# Patient Record
Sex: Male | Born: 2000 | Hispanic: Yes | Marital: Single | Smoking: Never smoker
Health system: Southern US, Community
[De-identification: ages and names within clinical notes are randomized; demographics above are authoritative.]

## PROBLEM LIST (undated history)

## (undated) HISTORY — PX: APPENDECTOMY: SHX54

---
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
## 2018-02-21 ENCOUNTER — Other Ambulatory Visit: Payer: Self-pay

## 2018-02-21 ENCOUNTER — Encounter: Payer: Self-pay | Admitting: Emergency Medicine

## 2018-02-21 ENCOUNTER — Emergency Department
Admission: EM | Admit: 2018-02-21 | Discharge: 2018-02-21 | Disposition: A | Discharge status: Home / Self Care | Payer: Medicaid Other | Attending: Emergency Medicine | Admitting: Emergency Medicine

## 2018-02-21 DIAGNOSIS — L509 Urticaria, unspecified: Secondary | ICD-10-CM | POA: Diagnosis not present

## 2018-02-21 DIAGNOSIS — R21 Rash and other nonspecific skin eruption: Secondary | ICD-10-CM | POA: Diagnosis present

## 2018-02-21 MED ORDER — DIPHENHYDRAMINE HCL 25 MG PO CAPS
50.0000 mg | ORAL_CAPSULE | Freq: Once | ORAL | Status: AC
  Administered 2018-02-21: 50 mg via ORAL
  Filled 2018-02-21: qty 2

## 2018-02-21 MED ORDER — PREDNISONE 10 MG PO TABS: ORAL_TABLET | ORAL | 0 refills | Status: AC

## 2018-02-21 MED ORDER — DEXAMETHASONE SODIUM PHOSPHATE 10 MG/ML IJ SOLN
10.0000 mg | Freq: Once | INTRAMUSCULAR | Status: AC
  Administered 2018-02-21: 10 mg via INTRAMUSCULAR
  Filled 2018-02-21: qty 1

## 2018-02-21 MED ORDER — DIPHENHYDRAMINE HCL 50 MG PO TABS: 50.0000 mg | ORAL_TABLET | Freq: Four times a day (QID) | ORAL | 0 refills | Status: AC | PRN

## 2018-02-21 NOTE — ED Provider Notes (Signed)
[facility] Emergency Department Provider Note   ____________________________________________   None    (approximate)  I have reviewed the triage vital signs and the nursing notes.   HISTORY  Chief Complaint Rash   HPI Todd Cole is a 17 y.o. male is here with complaint of rash.  Patient states that it began itching.  Mother took a picture of the rash last evening which is multiple erythematous patches.  Patient has not taken any medication for the itching.  He states that he only slept one hour.  He is fearful that these are bedbugs.  He denies any difficulty with breathing, speaking, or swallowing.  Mother states that he has had allergies in the past.  History reviewed. No pertinent past medical history.  There are no active problems to display for this patient.   Past Surgical History:  Procedure Laterality Date  . APPENDECTOMY      Prior to Admission medications   Medication Sig Start Date End Date Taking? Authorizing Provider  diphenhydrAMINE (BENADRYL) 50 MG tablet Take 1 tablet (50 mg total) by mouth every 6 (six) hours as needed for itching. 02/21/18   Tommi Rumps, PA-C  predniSONE (DELTASONE) 10 MG tablet Take 6 tablets  today, on day 2 take 5 tablets, day 3 take 4 tablets, day 4 take 3 tablets, day 5 take  2 tablets and 1 tablet the last day 02/21/18   Tommi Rumps, PA-C    Allergies Patient has no known allergies.  No family history on file.  Social History Social History   Tobacco Use  . Smoking status: Never Smoker  . Smokeless tobacco: Never Used  Substance Use Topics  . Alcohol use: Not on file  . Drug use: Not on file    Review of Systems Constitutional: No fever/chills Eyes: No visual changes. ENT: No sore throat. Cardiovascular: Denies chest pain. Respiratory: Denies shortness of breath. Gastrointestinal:  No nausea, no vomiting.   Musculoskeletal: Negative for  muscle aches Skin: Positive for  rash. Neurological: Negative for headaches, focal weakness or numbness. ____________________________________________   PHYSICAL EXAM:  VITAL SIGNS: ED Triage Vitals  Enc Vitals Group     BP 02/21/18 0628 (!) 142/78     Pulse Rate 02/21/18 0628 79     Resp 02/21/18 0628 18     Temp 02/21/18 0628 98 F (36.7 C)     Temp Source 02/21/18 0628 Oral     SpO2 02/21/18 0628 97 %     Weight 02/21/18 0625 250 lb (113.4 kg)     Height 02/21/18 0625  (1.803 m)     Head Circumference --      Peak Flow --      Pain Score 02/21/18 0624 0     Pain Loc --      Pain Edu? --      Excl. in GC? --    Constitutional: Alert and oriented. Well appearing and in no acute distress. Eyes: Conjunctivae are normal.  Head: Atraumatic. Nose: No congestion/rhinnorhea. Mouth/Throat: Mucous membranes are moist.  Oropharynx non-erythematous.  No edema and uvula is midline. Neck: No stridor.   Cardiovascular: Normal rate, regular rhythm. Grossly normal heart sounds.  Good peripheral circulation. Respiratory: Normal respiratory effort.  No retractions. Lungs CTAB. Musculoskeletal: Moves upper and lower extremities then difficulty.  Normal gait was noted. Neurologic:  Normal speech and language. No gross focal neurologic deficits are appreciated. No gait instability. Skin:  Skin is warm, dry  and intact.  Diffuse macular erythematous patches over the trunk and extremities.  In comparison with recent pictures erythematous areas are faded. Psychiatric: Mood and affect are normal. Speech and behavior are normal.  ____________________________________________   LABS (all labs ordered are listed, but only abnormal results are displayed)  Labs Reviewed - No data to display  PROCEDURES  Procedure(s) performed: None  Procedures  Critical Care performed: No  ____________________________________________   INITIAL IMPRESSION / ASSESSMENT AND PLAN / ED COURSE  As part of my medical decision making, I  reviewed the following data within the electronic MEDICAL RECORD NUMBER Notes from prior ED visits and [place] Controlled Substance Database  Patient was given Decadron IM and Benadryl 50 mg p.o.  Patient also was given a prescription for prednisone 60 mg 6-day taper.  He is to continue taking Benadryl as needed for itching.  Mother will make an appointment with the pediatrician and for further allergy testing.  She is aware that if his symptoms are worsening she is to come to the emergency department immediately.  ____________________________________________   FINAL CLINICAL IMPRESSION(S) / ED DIAGNOSES  Final diagnoses:  Urticaria     ED Discharge Orders        Ordered    predniSONE (DELTASONE) 10 MG tablet     02/21/18 0747    diphenhydrAMINE (BENADRYL) 50 MG tablet  Every 6 hours PRN     02/21/18 0747       Note:  This document was prepared using Dragon voice recognition software and may include unintentional dictation errors.    Tommi Rumps, PA-C 02/21/18 1154    Sharman Cheek, MD 02/22/18 1538

## 2018-02-21 NOTE — ED Notes (Signed)
Pt reports Wednesday started with a small rash on his arm and now he has a rash and hives over his body. Denies use of new lotions, clothes, foods, etc. Denies SOB

## 2018-02-21 NOTE — ED Notes (Signed)
Pr verbalizes understanding of d/c instructions, and follow up

## 2018-02-21 NOTE — Discharge Instructions (Addendum)
Follow-up with your child's pediatrician if any continued problems.  He will need to have allergy testing to determine what is causing his hives.  Continue with Benadryl every 6 hours as needed for itching.  Begin taking prednisone today starting with 6 tablets and tapering down.

## 2018-02-21 NOTE — ED Triage Notes (Signed)
Patient ambulatory to triage with steady gait, without difficulty or distress noted; pt reports generalized itching & rash with no known cause

## 2018-11-29 ENCOUNTER — Emergency Department
Admission: EM | Admit: 2018-11-29 | Discharge: 2018-11-29 | Disposition: A | Discharge status: Home / Self Care | Payer: Medicaid Other | Attending: Emergency Medicine | Admitting: Emergency Medicine

## 2018-11-29 ENCOUNTER — Other Ambulatory Visit: Payer: Self-pay

## 2018-11-29 DIAGNOSIS — H9192 Unspecified hearing loss, left ear: Secondary | ICD-10-CM | POA: Diagnosis present

## 2018-11-29 DIAGNOSIS — H6123 Impacted cerumen, bilateral: Secondary | ICD-10-CM | POA: Diagnosis not present

## 2018-11-29 NOTE — ED Provider Notes (Signed)
[facility] Emergency Department Provider Note   ____________________________________________   First MD Initiated Contact with Patient 11/29/18 0308     (approximate)  I have reviewed the triage vital signs and the nursing notes.   HISTORY  Chief Complaint left ear hearing loss    HPI Todd Cole is a 18 y.o. male who presents to the ED from home with a chief complaint of left ear feeling "plugged up".  Patient states he cleans his ears daily with a Q-tip.  It had been about 1 week since he last cleaned his ears.  States he was cleaning his ears tonight when he felt like the Q-tip pushed some earwax against his eardrum.  He tried rinsing it out with water but it made it feel worse.  His family member brought some liquid ear wax remover which he applied and got a tiny bit of earwax out.  Denies blood on the end of his Q-tip.  Denies recent barotrauma.  Voices no other complaints.    Past medical history None  There are no active problems to display for this patient.   Past Surgical History:  Procedure Laterality Date  . APPENDECTOMY      Prior to Admission medications   Medication Sig Start Date End Date Taking? Authorizing Provider  diphenhydrAMINE (BENADRYL) 50 MG tablet Take 1 tablet (50 mg total) by mouth every 6 (six) hours as needed for itching. 02/21/18   Tommi Rumps, PA-C  predniSONE (DELTASONE) 10 MG tablet Take 6 tablets  today, on day 2 take 5 tablets, day 3 take 4 tablets, day 4 take 3 tablets, day 5 take  2 tablets and 1 tablet the last day 02/21/18   Tommi Rumps, PA-C    Allergies Patient has no known allergies.  No family history on file.  Social History Social History   Tobacco Use  . Smoking status: Never Smoker  . Smokeless tobacco: Never Used  Substance Use Topics  . Alcohol use: Not on file  . Drug use: Not on file    Review of Systems  Constitutional: No fever/chills Eyes: No visual  changes. ENT: Positive for left ear feeling plugged up.  No sore throat. Cardiovascular: Denies chest pain. Respiratory: Denies shortness of breath. Gastrointestinal: No abdominal pain.  No nausea, no vomiting.  No diarrhea.  No constipation. Genitourinary: Negative for dysuria. Musculoskeletal: Negative for back pain. Skin: Negative for rash. Neurological: Negative for headaches, focal weakness or numbness.   ____________________________________________   PHYSICAL EXAM:  VITAL SIGNS: ED Triage Vitals  Enc Vitals Group     BP 11/29/18 0101 130/80     Pulse Rate 11/29/18 0101 86     Resp 11/29/18 0101 16     Temp 11/29/18 0101 98.5 F (36.9 C)     Temp Source 11/29/18 0101 Oral     SpO2 11/29/18 0101 100 %     Weight 11/29/18 0102 270 lb (122.5 kg)     Height 11/29/18 0102 5\' 11"  (1.803 m)     Head Circumference --      Peak Flow --      Pain Score 11/29/18 0101 0     Pain Loc --      Pain Edu? --      Excl. in GC? --     Constitutional: Alert and oriented. Well appearing and in no acute distress. Eyes: Conjunctivae are normal. PERRL. EOMI. Head: Atraumatic. Ears: Bilateral cerumen impaction. Nose: No congestion/rhinnorhea. Mouth/Throat: Mucous  membranes are moist.  Oropharynx non-erythematous. Neck: No stridor.   Cardiovascular: Normal rate, regular rhythm. Grossly normal heart sounds.  Good peripheral circulation. Respiratory: Normal respiratory effort.  No retractions. Lungs CTAB. Gastrointestinal: Soft and nontender. No distention. No abdominal bruits. No CVA tenderness. Musculoskeletal: No lower extremity tenderness nor edema.  No joint effusions. Neurologic:  Normal speech and language. No gross focal neurologic deficits are appreciated. No gait instability. Skin:  Skin is warm, dry and intact. No rash noted. Psychiatric: Mood and affect are normal. Speech and behavior are normal.  ____________________________________________   LABS (all labs ordered are  listed, but only abnormal results are displayed)  Labs Reviewed - No data to display ____________________________________________  EKG  None ____________________________________________  RADIOLOGY  ED MD interpretation: None  Official radiology report(s): No results found.  ____________________________________________   PROCEDURES  Procedure(s) performed: None  Procedures  Critical Care performed: No  ____________________________________________   INITIAL IMPRESSION / ASSESSMENT AND PLAN / ED COURSE  As part of my medical decision making, I reviewed the following data within the electronic MEDICAL RECORD NUMBER Nursing notes reviewed and incorporated and Notes from prior ED visits    18 year old male who presents with bilateral cerumen impaction.  Clinical Course as of Nov 30 455  Sat Nov 29, 2018  0455 Nursing irrigated both ears; large chunks of cerumen extracted.  Patient is feeling significantly better.  Reexamined bilateral TMs which are both intact.  Cautioned patient against using Q-tips daily.  Strict return precautions given.  Patient and family verbalized understanding agree with plan of care.   [JS]    Clinical Course User Index [JS] Irean Hong, MD     ____________________________________________   FINAL CLINICAL IMPRESSION(S) / ED DIAGNOSES  Final diagnoses:  Bilateral impacted cerumen     ED Discharge Orders    None       Note:  This document was prepared using Dragon voice recognition software and may include unintentional dictation errors.   Irean Hong, MD 11/29/18 [phone]

## 2018-11-29 NOTE — Discharge Instructions (Addendum)
Use drops for earwax removal.  Avoid Q-tips when possible.  Return to the ER for worsening symptoms, persistent vomiting, difficulty breathing or other concerns.

## 2018-11-29 NOTE — ED Notes (Signed)
Pharm called for hydrogen peroxide

## 2018-11-29 NOTE — ED Triage Notes (Signed)
Pt states was cleaning left ear with q tip tonight when he began to experience left sided ear hearing loss. Pt states "It feels plugged up". Pt denies pain, drainage.

## 2018-11-29 NOTE — ED Notes (Signed)
Left ear re-flushed minor bits of cerumen removed

## 2018-11-29 NOTE — ED Notes (Signed)
After 35 mins of flushing both ears, right tympanic visualized clear, left side still occluded, copious amounts of cerumen removed from both sides

## 2018-11-29 NOTE — ED Notes (Signed)
Pt with family reports after cleaning ears with q-tip sudden decrease in left side hearing, used OTC ear wax removal and feel this made problems worse

## 2018-11-29 NOTE — ED Notes (Signed)
No peripheral IV placed this visit.   Discharge instructions reviewed with patient. Questions fielded by this RN. Patient verbalizes understanding of instructions. Patient discharged home in stable condition per sung. No acute distress noted at time of discharge.   

## 2022-09-07 DIAGNOSIS — Z419 Encounter for procedure for purposes other than remedying health state, unspecified: Secondary | ICD-10-CM | POA: Diagnosis not present

## 2022-09-14 DIAGNOSIS — F419 Anxiety disorder, unspecified: Secondary | ICD-10-CM | POA: Diagnosis not present

## 2022-09-14 DIAGNOSIS — F900 Attention-deficit hyperactivity disorder, predominantly inattentive type: Secondary | ICD-10-CM | POA: Diagnosis not present

## 2022-09-26 DIAGNOSIS — F419 Anxiety disorder, unspecified: Secondary | ICD-10-CM | POA: Diagnosis not present

## 2022-09-26 DIAGNOSIS — F900 Attention-deficit hyperactivity disorder, predominantly inattentive type: Secondary | ICD-10-CM | POA: Diagnosis not present

## 2022-10-08 DIAGNOSIS — Z419 Encounter for procedure for purposes other than remedying health state, unspecified: Secondary | ICD-10-CM | POA: Diagnosis not present

## 2022-10-11 DIAGNOSIS — F900 Attention-deficit hyperactivity disorder, predominantly inattentive type: Secondary | ICD-10-CM | POA: Diagnosis not present

## 2022-10-11 DIAGNOSIS — F419 Anxiety disorder, unspecified: Secondary | ICD-10-CM | POA: Diagnosis not present

## 2022-10-16 DIAGNOSIS — F900 Attention-deficit hyperactivity disorder, predominantly inattentive type: Secondary | ICD-10-CM | POA: Diagnosis not present

## 2022-10-16 DIAGNOSIS — F419 Anxiety disorder, unspecified: Secondary | ICD-10-CM | POA: Diagnosis not present

## 2022-11-02 DIAGNOSIS — F419 Anxiety disorder, unspecified: Secondary | ICD-10-CM | POA: Diagnosis not present

## 2022-11-02 DIAGNOSIS — F900 Attention-deficit hyperactivity disorder, predominantly inattentive type: Secondary | ICD-10-CM | POA: Diagnosis not present

## 2022-11-06 DIAGNOSIS — F900 Attention-deficit hyperactivity disorder, predominantly inattentive type: Secondary | ICD-10-CM | POA: Diagnosis not present

## 2022-11-06 DIAGNOSIS — F419 Anxiety disorder, unspecified: Secondary | ICD-10-CM | POA: Diagnosis not present

## 2022-11-08 DIAGNOSIS — Z419 Encounter for procedure for purposes other than remedying health state, unspecified: Secondary | ICD-10-CM | POA: Diagnosis not present

## 2022-11-13 DIAGNOSIS — F900 Attention-deficit hyperactivity disorder, predominantly inattentive type: Secondary | ICD-10-CM | POA: Diagnosis not present

## 2022-11-20 DIAGNOSIS — F900 Attention-deficit hyperactivity disorder, predominantly inattentive type: Secondary | ICD-10-CM | POA: Diagnosis not present

## 2022-11-27 DIAGNOSIS — F900 Attention-deficit hyperactivity disorder, predominantly inattentive type: Secondary | ICD-10-CM | POA: Diagnosis not present

## 2022-12-04 DIAGNOSIS — F900 Attention-deficit hyperactivity disorder, predominantly inattentive type: Secondary | ICD-10-CM | POA: Diagnosis not present

## 2022-12-07 DIAGNOSIS — Z419 Encounter for procedure for purposes other than remedying health state, unspecified: Secondary | ICD-10-CM | POA: Diagnosis not present

## 2022-12-25 DIAGNOSIS — F419 Anxiety disorder, unspecified: Secondary | ICD-10-CM | POA: Diagnosis not present

## 2022-12-25 DIAGNOSIS — F900 Attention-deficit hyperactivity disorder, predominantly inattentive type: Secondary | ICD-10-CM | POA: Diagnosis not present

## 2023-01-01 DIAGNOSIS — F900 Attention-deficit hyperactivity disorder, predominantly inattentive type: Secondary | ICD-10-CM | POA: Diagnosis not present

## 2023-01-07 DIAGNOSIS — Z419 Encounter for procedure for purposes other than remedying health state, unspecified: Secondary | ICD-10-CM | POA: Diagnosis not present

## 2023-01-11 DIAGNOSIS — F900 Attention-deficit hyperactivity disorder, predominantly inattentive type: Secondary | ICD-10-CM | POA: Diagnosis not present

## 2023-01-11 DIAGNOSIS — F419 Anxiety disorder, unspecified: Secondary | ICD-10-CM | POA: Diagnosis not present

## 2023-01-22 DIAGNOSIS — F900 Attention-deficit hyperactivity disorder, predominantly inattentive type: Secondary | ICD-10-CM | POA: Diagnosis not present

## 2023-01-22 DIAGNOSIS — F419 Anxiety disorder, unspecified: Secondary | ICD-10-CM | POA: Diagnosis not present

## 2023-02-06 DIAGNOSIS — Z6834 Body mass index (BMI) 34.0-34.9, adult: Secondary | ICD-10-CM | POA: Diagnosis not present

## 2023-02-06 DIAGNOSIS — F900 Attention-deficit hyperactivity disorder, predominantly inattentive type: Secondary | ICD-10-CM | POA: Diagnosis not present

## 2023-02-06 DIAGNOSIS — L732 Hidradenitis suppurativa: Secondary | ICD-10-CM | POA: Diagnosis not present

## 2023-02-06 DIAGNOSIS — E6609 Other obesity due to excess calories: Secondary | ICD-10-CM | POA: Diagnosis not present

## 2023-02-06 DIAGNOSIS — Z419 Encounter for procedure for purposes other than remedying health state, unspecified: Secondary | ICD-10-CM | POA: Diagnosis not present

## 2023-02-12 DIAGNOSIS — F900 Attention-deficit hyperactivity disorder, predominantly inattentive type: Secondary | ICD-10-CM | POA: Diagnosis not present

## 2023-02-12 DIAGNOSIS — F419 Anxiety disorder, unspecified: Secondary | ICD-10-CM | POA: Diagnosis not present

## 2023-02-26 DIAGNOSIS — F419 Anxiety disorder, unspecified: Secondary | ICD-10-CM | POA: Diagnosis not present

## 2023-02-26 DIAGNOSIS — F900 Attention-deficit hyperactivity disorder, predominantly inattentive type: Secondary | ICD-10-CM | POA: Diagnosis not present

## 2023-03-09 DIAGNOSIS — Z419 Encounter for procedure for purposes other than remedying health state, unspecified: Secondary | ICD-10-CM | POA: Diagnosis not present

## 2023-03-11 DIAGNOSIS — Z0000 Encounter for general adult medical examination without abnormal findings: Secondary | ICD-10-CM | POA: Diagnosis not present

## 2023-03-11 DIAGNOSIS — Z23 Encounter for immunization: Secondary | ICD-10-CM | POA: Diagnosis not present

## 2023-03-11 DIAGNOSIS — M721 Knuckle pads: Secondary | ICD-10-CM | POA: Diagnosis not present

## 2023-03-12 DIAGNOSIS — F419 Anxiety disorder, unspecified: Secondary | ICD-10-CM | POA: Diagnosis not present

## 2023-03-12 DIAGNOSIS — F900 Attention-deficit hyperactivity disorder, predominantly inattentive type: Secondary | ICD-10-CM | POA: Diagnosis not present

## 2023-03-19 DIAGNOSIS — F900 Attention-deficit hyperactivity disorder, predominantly inattentive type: Secondary | ICD-10-CM | POA: Diagnosis not present

## 2023-04-02 DIAGNOSIS — L732 Hidradenitis suppurativa: Secondary | ICD-10-CM | POA: Diagnosis not present

## 2023-04-02 DIAGNOSIS — F900 Attention-deficit hyperactivity disorder, predominantly inattentive type: Secondary | ICD-10-CM | POA: Diagnosis not present

## 2023-04-02 DIAGNOSIS — F419 Anxiety disorder, unspecified: Secondary | ICD-10-CM | POA: Diagnosis not present

## 2023-04-08 DIAGNOSIS — Z419 Encounter for procedure for purposes other than remedying health state, unspecified: Secondary | ICD-10-CM | POA: Diagnosis not present

## 2023-04-16 DIAGNOSIS — F419 Anxiety disorder, unspecified: Secondary | ICD-10-CM | POA: Diagnosis not present

## 2023-04-16 DIAGNOSIS — F900 Attention-deficit hyperactivity disorder, predominantly inattentive type: Secondary | ICD-10-CM | POA: Diagnosis not present

## 2023-04-23 DIAGNOSIS — F900 Attention-deficit hyperactivity disorder, predominantly inattentive type: Secondary | ICD-10-CM | POA: Diagnosis not present

## 2023-04-23 DIAGNOSIS — F419 Anxiety disorder, unspecified: Secondary | ICD-10-CM | POA: Diagnosis not present

## 2023-05-06 DIAGNOSIS — F419 Anxiety disorder, unspecified: Secondary | ICD-10-CM | POA: Diagnosis not present

## 2023-05-06 DIAGNOSIS — F900 Attention-deficit hyperactivity disorder, predominantly inattentive type: Secondary | ICD-10-CM | POA: Diagnosis not present

## 2023-05-09 DIAGNOSIS — Z419 Encounter for procedure for purposes other than remedying health state, unspecified: Secondary | ICD-10-CM | POA: Diagnosis not present

## 2023-06-04 DIAGNOSIS — F900 Attention-deficit hyperactivity disorder, predominantly inattentive type: Secondary | ICD-10-CM | POA: Diagnosis not present

## 2023-06-04 DIAGNOSIS — F419 Anxiety disorder, unspecified: Secondary | ICD-10-CM | POA: Diagnosis not present

## 2023-06-09 DIAGNOSIS — Z419 Encounter for procedure for purposes other than remedying health state, unspecified: Secondary | ICD-10-CM | POA: Diagnosis not present

## 2023-06-18 DIAGNOSIS — F419 Anxiety disorder, unspecified: Secondary | ICD-10-CM | POA: Diagnosis not present

## 2023-06-18 DIAGNOSIS — F900 Attention-deficit hyperactivity disorder, predominantly inattentive type: Secondary | ICD-10-CM | POA: Diagnosis not present

## 2023-07-02 DIAGNOSIS — F419 Anxiety disorder, unspecified: Secondary | ICD-10-CM | POA: Diagnosis not present

## 2023-07-02 DIAGNOSIS — F900 Attention-deficit hyperactivity disorder, predominantly inattentive type: Secondary | ICD-10-CM | POA: Diagnosis not present

## 2023-07-09 DIAGNOSIS — Z419 Encounter for procedure for purposes other than remedying health state, unspecified: Secondary | ICD-10-CM | POA: Diagnosis not present

## 2023-07-16 DIAGNOSIS — F900 Attention-deficit hyperactivity disorder, predominantly inattentive type: Secondary | ICD-10-CM | POA: Diagnosis not present

## 2023-07-16 DIAGNOSIS — F419 Anxiety disorder, unspecified: Secondary | ICD-10-CM | POA: Diagnosis not present

## 2023-08-06 DIAGNOSIS — F900 Attention-deficit hyperactivity disorder, predominantly inattentive type: Secondary | ICD-10-CM | POA: Diagnosis not present

## 2023-08-06 DIAGNOSIS — F419 Anxiety disorder, unspecified: Secondary | ICD-10-CM | POA: Diagnosis not present

## 2023-08-09 DIAGNOSIS — Z419 Encounter for procedure for purposes other than remedying health state, unspecified: Secondary | ICD-10-CM | POA: Diagnosis not present

## 2023-08-27 DIAGNOSIS — F900 Attention-deficit hyperactivity disorder, predominantly inattentive type: Secondary | ICD-10-CM | POA: Diagnosis not present

## 2023-08-27 DIAGNOSIS — F419 Anxiety disorder, unspecified: Secondary | ICD-10-CM | POA: Diagnosis not present

## 2023-09-08 DIAGNOSIS — Z419 Encounter for procedure for purposes other than remedying health state, unspecified: Secondary | ICD-10-CM | POA: Diagnosis not present

## 2023-09-24 DIAGNOSIS — F900 Attention-deficit hyperactivity disorder, predominantly inattentive type: Secondary | ICD-10-CM | POA: Diagnosis not present

## 2023-09-24 DIAGNOSIS — F419 Anxiety disorder, unspecified: Secondary | ICD-10-CM | POA: Diagnosis not present

## 2023-10-09 DIAGNOSIS — Z419 Encounter for procedure for purposes other than remedying health state, unspecified: Secondary | ICD-10-CM | POA: Diagnosis not present

## 2023-10-29 DIAGNOSIS — F419 Anxiety disorder, unspecified: Secondary | ICD-10-CM | POA: Diagnosis not present

## 2023-10-29 DIAGNOSIS — F900 Attention-deficit hyperactivity disorder, predominantly inattentive type: Secondary | ICD-10-CM | POA: Diagnosis not present

## 2023-11-09 DIAGNOSIS — Z419 Encounter for procedure for purposes other than remedying health state, unspecified: Secondary | ICD-10-CM | POA: Diagnosis not present

## 2023-11-21 DIAGNOSIS — F900 Attention-deficit hyperactivity disorder, predominantly inattentive type: Secondary | ICD-10-CM | POA: Diagnosis not present

## 2023-11-21 DIAGNOSIS — F419 Anxiety disorder, unspecified: Secondary | ICD-10-CM | POA: Diagnosis not present

## 2023-12-07 DIAGNOSIS — Z419 Encounter for procedure for purposes other than remedying health state, unspecified: Secondary | ICD-10-CM | POA: Diagnosis not present

## 2023-12-10 DIAGNOSIS — F419 Anxiety disorder, unspecified: Secondary | ICD-10-CM | POA: Diagnosis not present

## 2023-12-10 DIAGNOSIS — F900 Attention-deficit hyperactivity disorder, predominantly inattentive type: Secondary | ICD-10-CM | POA: Diagnosis not present

## 2023-12-19 DIAGNOSIS — F900 Attention-deficit hyperactivity disorder, predominantly inattentive type: Secondary | ICD-10-CM | POA: Diagnosis not present

## 2023-12-19 DIAGNOSIS — F419 Anxiety disorder, unspecified: Secondary | ICD-10-CM | POA: Diagnosis not present

## 2023-12-30 DIAGNOSIS — S8392XA Sprain of unspecified site of left knee, initial encounter: Secondary | ICD-10-CM | POA: Diagnosis not present

## 2023-12-31 DIAGNOSIS — F419 Anxiety disorder, unspecified: Secondary | ICD-10-CM | POA: Diagnosis not present

## 2023-12-31 DIAGNOSIS — F900 Attention-deficit hyperactivity disorder, predominantly inattentive type: Secondary | ICD-10-CM | POA: Diagnosis not present

## 2024-01-16 DIAGNOSIS — F419 Anxiety disorder, unspecified: Secondary | ICD-10-CM | POA: Diagnosis not present

## 2024-01-16 DIAGNOSIS — F900 Attention-deficit hyperactivity disorder, predominantly inattentive type: Secondary | ICD-10-CM | POA: Diagnosis not present

## 2024-01-18 DIAGNOSIS — Z419 Encounter for procedure for purposes other than remedying health state, unspecified: Secondary | ICD-10-CM | POA: Diagnosis not present

## 2024-01-28 DIAGNOSIS — F900 Attention-deficit hyperactivity disorder, predominantly inattentive type: Secondary | ICD-10-CM | POA: Diagnosis not present

## 2024-01-28 DIAGNOSIS — F419 Anxiety disorder, unspecified: Secondary | ICD-10-CM | POA: Diagnosis not present

## 2024-02-11 DIAGNOSIS — F900 Attention-deficit hyperactivity disorder, predominantly inattentive type: Secondary | ICD-10-CM | POA: Diagnosis not present

## 2024-02-11 DIAGNOSIS — F419 Anxiety disorder, unspecified: Secondary | ICD-10-CM | POA: Diagnosis not present

## 2024-02-17 DIAGNOSIS — Z419 Encounter for procedure for purposes other than remedying health state, unspecified: Secondary | ICD-10-CM | POA: Diagnosis not present

## 2024-02-25 DIAGNOSIS — F900 Attention-deficit hyperactivity disorder, predominantly inattentive type: Secondary | ICD-10-CM | POA: Diagnosis not present

## 2024-02-25 DIAGNOSIS — F419 Anxiety disorder, unspecified: Secondary | ICD-10-CM | POA: Diagnosis not present

## 2024-03-10 DIAGNOSIS — F419 Anxiety disorder, unspecified: Secondary | ICD-10-CM | POA: Diagnosis not present

## 2024-03-10 DIAGNOSIS — F900 Attention-deficit hyperactivity disorder, predominantly inattentive type: Secondary | ICD-10-CM | POA: Diagnosis not present
